# Patient Record
Sex: Female | Born: 1965 | Race: White | Hispanic: No | Smoking: Never smoker
Health system: Southern US, Community
[De-identification: ages and names within clinical notes are randomized; demographics above are authoritative.]

## PROBLEM LIST (undated history)

## (undated) HISTORY — PX: OTHER SURGICAL HISTORY: SHX169

---

## 1994-01-21 HISTORY — PX: OTHER SURGICAL HISTORY: SHX169

## 2015-04-18 DIAGNOSIS — M9901 Segmental and somatic dysfunction of cervical region: Secondary | ICD-10-CM | POA: Diagnosis not present

## 2015-04-18 DIAGNOSIS — M5137 Other intervertebral disc degeneration, lumbosacral region: Secondary | ICD-10-CM | POA: Diagnosis not present

## 2015-04-18 DIAGNOSIS — M4316 Spondylolisthesis, lumbar region: Secondary | ICD-10-CM | POA: Diagnosis not present

## 2015-04-18 DIAGNOSIS — M9903 Segmental and somatic dysfunction of lumbar region: Secondary | ICD-10-CM | POA: Diagnosis not present

## 2015-04-18 DIAGNOSIS — M9902 Segmental and somatic dysfunction of thoracic region: Secondary | ICD-10-CM | POA: Diagnosis not present

## 2015-04-19 DIAGNOSIS — M5137 Other intervertebral disc degeneration, lumbosacral region: Secondary | ICD-10-CM | POA: Diagnosis not present

## 2015-04-19 DIAGNOSIS — M9901 Segmental and somatic dysfunction of cervical region: Secondary | ICD-10-CM | POA: Diagnosis not present

## 2015-04-19 DIAGNOSIS — M9902 Segmental and somatic dysfunction of thoracic region: Secondary | ICD-10-CM | POA: Diagnosis not present

## 2015-04-19 DIAGNOSIS — M9903 Segmental and somatic dysfunction of lumbar region: Secondary | ICD-10-CM | POA: Diagnosis not present

## 2015-04-19 DIAGNOSIS — M4316 Spondylolisthesis, lumbar region: Secondary | ICD-10-CM | POA: Diagnosis not present

## 2015-04-25 DIAGNOSIS — M5137 Other intervertebral disc degeneration, lumbosacral region: Secondary | ICD-10-CM | POA: Diagnosis not present

## 2015-04-25 DIAGNOSIS — M9902 Segmental and somatic dysfunction of thoracic region: Secondary | ICD-10-CM | POA: Diagnosis not present

## 2015-04-25 DIAGNOSIS — M4316 Spondylolisthesis, lumbar region: Secondary | ICD-10-CM | POA: Diagnosis not present

## 2015-04-25 DIAGNOSIS — M9903 Segmental and somatic dysfunction of lumbar region: Secondary | ICD-10-CM | POA: Diagnosis not present

## 2015-04-25 DIAGNOSIS — M9901 Segmental and somatic dysfunction of cervical region: Secondary | ICD-10-CM | POA: Diagnosis not present

## 2015-04-26 DIAGNOSIS — M9902 Segmental and somatic dysfunction of thoracic region: Secondary | ICD-10-CM | POA: Diagnosis not present

## 2015-04-26 DIAGNOSIS — M5137 Other intervertebral disc degeneration, lumbosacral region: Secondary | ICD-10-CM | POA: Diagnosis not present

## 2015-04-26 DIAGNOSIS — M9903 Segmental and somatic dysfunction of lumbar region: Secondary | ICD-10-CM | POA: Diagnosis not present

## 2015-04-26 DIAGNOSIS — M4316 Spondylolisthesis, lumbar region: Secondary | ICD-10-CM | POA: Diagnosis not present

## 2015-04-26 DIAGNOSIS — M9901 Segmental and somatic dysfunction of cervical region: Secondary | ICD-10-CM | POA: Diagnosis not present

## 2015-04-28 DIAGNOSIS — M9902 Segmental and somatic dysfunction of thoracic region: Secondary | ICD-10-CM | POA: Diagnosis not present

## 2015-04-28 DIAGNOSIS — M4316 Spondylolisthesis, lumbar region: Secondary | ICD-10-CM | POA: Diagnosis not present

## 2015-04-28 DIAGNOSIS — M9901 Segmental and somatic dysfunction of cervical region: Secondary | ICD-10-CM | POA: Diagnosis not present

## 2015-04-28 DIAGNOSIS — M5137 Other intervertebral disc degeneration, lumbosacral region: Secondary | ICD-10-CM | POA: Diagnosis not present

## 2015-04-28 DIAGNOSIS — M9903 Segmental and somatic dysfunction of lumbar region: Secondary | ICD-10-CM | POA: Diagnosis not present

## 2015-05-02 DIAGNOSIS — M5137 Other intervertebral disc degeneration, lumbosacral region: Secondary | ICD-10-CM | POA: Diagnosis not present

## 2015-05-02 DIAGNOSIS — M9903 Segmental and somatic dysfunction of lumbar region: Secondary | ICD-10-CM | POA: Diagnosis not present

## 2015-05-02 DIAGNOSIS — M9901 Segmental and somatic dysfunction of cervical region: Secondary | ICD-10-CM | POA: Diagnosis not present

## 2015-05-02 DIAGNOSIS — M4316 Spondylolisthesis, lumbar region: Secondary | ICD-10-CM | POA: Diagnosis not present

## 2015-05-02 DIAGNOSIS — M9902 Segmental and somatic dysfunction of thoracic region: Secondary | ICD-10-CM | POA: Diagnosis not present

## 2015-05-03 DIAGNOSIS — M9901 Segmental and somatic dysfunction of cervical region: Secondary | ICD-10-CM | POA: Diagnosis not present

## 2015-05-03 DIAGNOSIS — M9903 Segmental and somatic dysfunction of lumbar region: Secondary | ICD-10-CM | POA: Diagnosis not present

## 2015-05-03 DIAGNOSIS — M9902 Segmental and somatic dysfunction of thoracic region: Secondary | ICD-10-CM | POA: Diagnosis not present

## 2015-05-03 DIAGNOSIS — M4316 Spondylolisthesis, lumbar region: Secondary | ICD-10-CM | POA: Diagnosis not present

## 2015-05-03 DIAGNOSIS — M5137 Other intervertebral disc degeneration, lumbosacral region: Secondary | ICD-10-CM | POA: Diagnosis not present

## 2015-05-05 DIAGNOSIS — M9902 Segmental and somatic dysfunction of thoracic region: Secondary | ICD-10-CM | POA: Diagnosis not present

## 2015-05-05 DIAGNOSIS — M9901 Segmental and somatic dysfunction of cervical region: Secondary | ICD-10-CM | POA: Diagnosis not present

## 2015-05-05 DIAGNOSIS — M5137 Other intervertebral disc degeneration, lumbosacral region: Secondary | ICD-10-CM | POA: Diagnosis not present

## 2015-05-05 DIAGNOSIS — M9903 Segmental and somatic dysfunction of lumbar region: Secondary | ICD-10-CM | POA: Diagnosis not present

## 2015-05-05 DIAGNOSIS — M4316 Spondylolisthesis, lumbar region: Secondary | ICD-10-CM | POA: Diagnosis not present

## 2015-05-16 DIAGNOSIS — M5137 Other intervertebral disc degeneration, lumbosacral region: Secondary | ICD-10-CM | POA: Diagnosis not present

## 2015-05-16 DIAGNOSIS — M4316 Spondylolisthesis, lumbar region: Secondary | ICD-10-CM | POA: Diagnosis not present

## 2015-05-16 DIAGNOSIS — M9901 Segmental and somatic dysfunction of cervical region: Secondary | ICD-10-CM | POA: Diagnosis not present

## 2015-05-16 DIAGNOSIS — M9903 Segmental and somatic dysfunction of lumbar region: Secondary | ICD-10-CM | POA: Diagnosis not present

## 2015-05-16 DIAGNOSIS — M9902 Segmental and somatic dysfunction of thoracic region: Secondary | ICD-10-CM | POA: Diagnosis not present

## 2015-05-19 DIAGNOSIS — M5137 Other intervertebral disc degeneration, lumbosacral region: Secondary | ICD-10-CM | POA: Diagnosis not present

## 2015-05-19 DIAGNOSIS — M9903 Segmental and somatic dysfunction of lumbar region: Secondary | ICD-10-CM | POA: Diagnosis not present

## 2015-05-19 DIAGNOSIS — M4316 Spondylolisthesis, lumbar region: Secondary | ICD-10-CM | POA: Diagnosis not present

## 2015-05-19 DIAGNOSIS — M9901 Segmental and somatic dysfunction of cervical region: Secondary | ICD-10-CM | POA: Diagnosis not present

## 2015-05-19 DIAGNOSIS — M9902 Segmental and somatic dysfunction of thoracic region: Secondary | ICD-10-CM | POA: Diagnosis not present

## 2015-05-26 DIAGNOSIS — M5137 Other intervertebral disc degeneration, lumbosacral region: Secondary | ICD-10-CM | POA: Diagnosis not present

## 2015-05-26 DIAGNOSIS — M9903 Segmental and somatic dysfunction of lumbar region: Secondary | ICD-10-CM | POA: Diagnosis not present

## 2015-05-26 DIAGNOSIS — M9902 Segmental and somatic dysfunction of thoracic region: Secondary | ICD-10-CM | POA: Diagnosis not present

## 2015-05-26 DIAGNOSIS — M4316 Spondylolisthesis, lumbar region: Secondary | ICD-10-CM | POA: Diagnosis not present

## 2015-05-26 DIAGNOSIS — M9901 Segmental and somatic dysfunction of cervical region: Secondary | ICD-10-CM | POA: Diagnosis not present

## 2015-06-01 DIAGNOSIS — M5137 Other intervertebral disc degeneration, lumbosacral region: Secondary | ICD-10-CM | POA: Diagnosis not present

## 2015-06-01 DIAGNOSIS — M9903 Segmental and somatic dysfunction of lumbar region: Secondary | ICD-10-CM | POA: Diagnosis not present

## 2015-06-01 DIAGNOSIS — M9902 Segmental and somatic dysfunction of thoracic region: Secondary | ICD-10-CM | POA: Diagnosis not present

## 2015-06-01 DIAGNOSIS — M4316 Spondylolisthesis, lumbar region: Secondary | ICD-10-CM | POA: Diagnosis not present

## 2015-06-01 DIAGNOSIS — M9901 Segmental and somatic dysfunction of cervical region: Secondary | ICD-10-CM | POA: Diagnosis not present

## 2018-01-23 DIAGNOSIS — D2262 Melanocytic nevi of left upper limb, including shoulder: Secondary | ICD-10-CM | POA: Diagnosis not present

## 2018-01-23 DIAGNOSIS — L570 Actinic keratosis: Secondary | ICD-10-CM | POA: Diagnosis not present

## 2018-01-23 DIAGNOSIS — Z85828 Personal history of other malignant neoplasm of skin: Secondary | ICD-10-CM | POA: Diagnosis not present

## 2018-01-23 DIAGNOSIS — Q825 Congenital non-neoplastic nevus: Secondary | ICD-10-CM | POA: Diagnosis not present

## 2018-03-18 ENCOUNTER — Other Ambulatory Visit: Payer: Self-pay | Admitting: Internal Medicine

## 2018-03-18 DIAGNOSIS — Z820 Family history of epilepsy and other diseases of the nervous system: Secondary | ICD-10-CM

## 2018-03-18 DIAGNOSIS — Z0000 Encounter for general adult medical examination without abnormal findings: Secondary | ICD-10-CM | POA: Diagnosis not present

## 2018-03-18 DIAGNOSIS — Z1331 Encounter for screening for depression: Secondary | ICD-10-CM | POA: Diagnosis not present

## 2018-03-18 DIAGNOSIS — Z823 Family history of stroke: Secondary | ICD-10-CM | POA: Diagnosis not present

## 2018-03-18 DIAGNOSIS — Z8249 Family history of ischemic heart disease and other diseases of the circulatory system: Secondary | ICD-10-CM

## 2018-03-31 ENCOUNTER — Ambulatory Visit
Admission: RE | Admit: 2018-03-31 | Discharge: 2018-03-31 | Disposition: A | Discharge status: Home / Self Care | Payer: BLUE CROSS/BLUE SHIELD | Source: Ambulatory Visit | Attending: Internal Medicine | Admitting: Internal Medicine

## 2018-03-31 DIAGNOSIS — Z8249 Family history of ischemic heart disease and other diseases of the circulatory system: Secondary | ICD-10-CM | POA: Diagnosis not present

## 2018-03-31 DIAGNOSIS — Z820 Family history of epilepsy and other diseases of the nervous system: Secondary | ICD-10-CM

## 2018-03-31 MED ORDER — IOPAMIDOL (ISOVUE-370) INJECTION 76%
75.0000 mL | Freq: Once | INTRAVENOUS | Status: AC | PRN
  Administered 2018-03-31: 75 mL via INTRAVENOUS

## 2018-07-27 DIAGNOSIS — Z20828 Contact with and (suspected) exposure to other viral communicable diseases: Secondary | ICD-10-CM | POA: Diagnosis not present

## 2018-08-07 ENCOUNTER — Encounter: Payer: Self-pay | Admitting: Gastroenterology

## 2018-08-25 ENCOUNTER — Ambulatory Visit (AMBULATORY_SURGERY_CENTER): Payer: Self-pay | Admitting: *Deleted

## 2018-08-25 ENCOUNTER — Other Ambulatory Visit: Payer: Self-pay

## 2018-08-25 VITALS — Temp 96.9°F | Ht 63.0 in | Wt 149.8 lb

## 2018-08-25 DIAGNOSIS — Z1211 Encounter for screening for malignant neoplasm of colon: Secondary | ICD-10-CM

## 2018-08-25 MED ORDER — NA SULFATE-K SULFATE-MG SULF 17.5-3.13-1.6 GM/177ML PO SOLN: 1.0000 | Freq: Once | ORAL | 0 refills | Status: AC

## 2018-08-25 NOTE — Progress Notes (Signed)
No egg or soy allergy known to patient  No issues with past sedation with any surgeries  or procedures, no intubation problems  No diet pills per patient No home 02 use per patient  No blood thinners per patient  Pt denies issues with constipation  No A fib or A flutter  EMMI video sent to pt's e mail  

## 2018-09-07 ENCOUNTER — Telehealth: Payer: Self-pay | Admitting: Gastroenterology

## 2018-09-07 NOTE — Telephone Encounter (Signed)

## 2018-09-08 ENCOUNTER — Other Ambulatory Visit: Payer: Self-pay

## 2018-09-08 ENCOUNTER — Ambulatory Visit (AMBULATORY_SURGERY_CENTER): Payer: BC Managed Care – PPO | Admitting: Gastroenterology

## 2018-09-08 ENCOUNTER — Encounter: Payer: Self-pay | Admitting: Gastroenterology

## 2018-09-08 VITALS — BP 101/59 | HR 56 | Temp 97.7°F | Resp 21 | Ht 63.0 in | Wt 149.0 lb

## 2018-09-08 DIAGNOSIS — Z1211 Encounter for screening for malignant neoplasm of colon: Secondary | ICD-10-CM | POA: Diagnosis not present

## 2018-09-08 DIAGNOSIS — K635 Polyp of colon: Secondary | ICD-10-CM

## 2018-09-08 DIAGNOSIS — D125 Benign neoplasm of sigmoid colon: Secondary | ICD-10-CM

## 2018-09-08 MED ORDER — SODIUM CHLORIDE 0.9 % IV SOLN: 500.0000 mL | INTRAVENOUS | Status: DC

## 2018-09-08 NOTE — Progress Notes (Signed)
Called to room to assist during endoscopic procedure.  Patient ID and intended procedure confirmed with present staff. Received instructions for my participation in the procedure from the performing physician.  

## 2018-09-08 NOTE — Op Note (Signed)
[facility] Patient Name: Amanda Kirby Procedure Date: 09/08/2018 9:46 AM MRN: [id number] Endoscopist: Ladene Artist , MD Age: 53 Referring MD:  Date of Birth: 04-03-1965 Gender: Female Account #: [id number] Procedure:                Colonoscopy Indications:              Screening for colorectal malignant neoplasm Medicines:                Monitored Anesthesia Care Procedure:                Pre-Anesthesia Assessment:                           - Prior to the procedure, a History and Physical                            was performed, and patient medications and                            allergies were reviewed. The patient's tolerance of                            previous anesthesia was also reviewed. The risks                            and benefits of the procedure and the sedation                            options and risks were discussed with the patient.                            All questions were answered, and informed consent                            was obtained. Prior Anticoagulants: The patient has                            taken no previous anticoagulant or antiplatelet                            agents. ASA Grade Assessment: II - A patient with                            mild systemic disease. After reviewing the risks                            and benefits, the patient was deemed in                            satisfactory condition to undergo the procedure.                           After obtaining informed consent, the colonoscope  was passed under direct vision. Throughout the                            procedure, the patient's blood pressure, pulse, and                            oxygen saturations were monitored continuously. The                            Colonoscope was introduced through the anus and                            advanced to the the cecum, identified by                            appendiceal orifice and  ileocecal valve. The                            ileocecal valve, appendiceal orifice, and rectum                            were photographed. The quality of the bowel                            preparation was excellent. The colonoscopy was                            performed without difficulty. The patient tolerated                            the procedure well. Scope In: 9:57:43 AM Scope Out: 10:15:17 AM Scope Withdrawal Time: 0 hours 10 minutes 42 seconds  Total Procedure Duration: 0 hours 17 minutes 34 seconds  Findings:                 The perianal and digital rectal examinations were                            normal.                           A 8 mm polyp was found in the sigmoid colon. The                            polyp was sessile. The polyp was removed with a                            cold snare. Resection and retrieval were complete.                           The exam was otherwise without abnormality on                            direct and retroflexion views. Complications:  No immediate complications. Estimated blood loss:                            None. Estimated Blood Loss:     Estimated blood loss: none. Impression:               - One 8 mm polyp in the sigmoid colon, removed with                            a cold snare. Resected and retrieved.                           - The examination was otherwise normal on direct                            and retroflexion views. Recommendation:           - Repeat colonoscopy timing based on pathology                            results.                           - Patient has a contact number available for                            emergencies. The signs and symptoms of potential                            delayed complications were discussed with the                            patient. Return to normal activities tomorrow.                            Written discharge instructions were provided to the                             patient.                           - Resume previous diet.                           - Continue present medications.                           - Await pathology results. Ladene Artist, MD 09/08/2018 10:20:44 AM This report has been signed electronically.

## 2018-09-08 NOTE — Progress Notes (Signed)
To PACU, VSS. Report to [email or web address] 

## 2018-09-08 NOTE — Progress Notes (Signed)
Nancy vital signs, Step. Temp. Celia IV, Coretta Leisey Checked in. Pt's states no medical or surgical changes since previsit .

## 2018-09-08 NOTE — Patient Instructions (Signed)
Information on polyps given to you today.  Await pathology results.   YOU HAD AN ENDOSCOPIC PROCEDURE TODAY AT THE [facility]:   Refer to the procedure report that was given to you for any specific questions about what was found during the examination.  If the procedure report does not answer your questions, please call your gastroenterologist to clarify.  If you requested that your care partner not be given the details of your procedure findings, then the procedure report has been included in a sealed envelope for you to review at your convenience later.  YOU SHOULD EXPECT: Some feelings of bloating in the abdomen. Passage of more gas than usual.  Walking can help get rid of the air that was put into your GI tract during the procedure and reduce the bloating. If you had a lower endoscopy (such as a colonoscopy or flexible sigmoidoscopy) you may notice spotting of blood in your stool or on the toilet paper. If you underwent a bowel prep for your procedure, you may not have a normal bowel movement for a few days.  Please Note:  You might notice some irritation and congestion in your nose or some drainage.  This is from the oxygen used during your procedure.  There is no need for concern and it should clear up in a day or so.  SYMPTOMS TO REPORT IMMEDIATELY:   Following lower endoscopy (colonoscopy or flexible sigmoidoscopy):  Excessive amounts of blood in the stool  Significant tenderness or worsening of abdominal pains  Swelling of the abdomen that is new, acute  Fever of 100F or higher   For urgent or emergent issues, a gastroenterologist can be reached at any hour by calling [phone].   DIET:  We do recommend a small meal at first, but then you may proceed to your regular diet.  Drink plenty of fluids but you should avoid alcoholic beverages for 24 hours.  ACTIVITY:  You should plan to take it easy for the rest of today and you should NOT DRIVE or use heavy machinery  until tomorrow (because of the sedation medicines used during the test).    FOLLOW UP: Our staff will call the number listed on your records 48-72 hours following your procedure to check on you and address any questions or concerns that you may have regarding the information given to you following your procedure. If we do not reach you, we will leave a message.  We will attempt to reach you two times.  During this call, we will ask if you have developed any symptoms of COVID 19. If you develop any symptoms (ie: fever, flu-like symptoms, shortness of breath, cough etc.) before then, please call [phone].  If you test positive for Covid 19 in the 2 weeks post procedure, please call and report this information to us.    If any biopsies were taken you will be contacted by phone or by letter within the next 1-3 weeks.  Please call us at [phone] if you have not heard about the biopsies in 3 weeks.    SIGNATURES/CONFIDENTIALITY: You and/or your care partner have signed paperwork which will be entered into your electronic medical record.  These signatures attest to the fact that that the information above on your After Visit Summary has been reviewed and is understood.  Full responsibility of the confidentiality of this discharge information lies with you and/or your care-partner. 

## 2018-09-10 ENCOUNTER — Telehealth: Payer: Self-pay

## 2018-09-10 NOTE — Telephone Encounter (Signed)
  Follow up Call-  Call back number 09/08/2018  Post procedure Call Back phone  # [phone]  Permission to leave phone message Yes  Some recent data might be hidden     Patient questions:  Do you have a fever, pain , or abdominal swelling? No. Pain Score  0 *  Have you tolerated food without any problems? Yes.    Have you been able to return to your normal activities? Yes.    Do you have any questions about your discharge instructions: Diet   No. Medications  No. Follow up visit  No.  Do you have questions or concerns about your Care? No.  Actions: * If pain score is 4 or above: 1. No action needed, pain <4.Have you developed a fever since your procedure? no  2.   Have you had an respiratory symptoms (SOB or cough) since your procedure? no  3.   Have you tested positive for COVID 19 since your procedure no  4.   Have you had any family members/close contacts diagnosed with the COVID 19 since your procedure?  no   If yes to any of these questions please route to Joylene John, RN and Alphonsa Gin, Therapist, sports.

## 2018-09-28 ENCOUNTER — Encounter: Payer: Self-pay | Admitting: Gastroenterology

## 2019-03-20 ENCOUNTER — Ambulatory Visit: Payer: Self-pay | Attending: Internal Medicine

## 2019-03-20 DIAGNOSIS — Z23 Encounter for immunization: Secondary | ICD-10-CM | POA: Insufficient documentation

## 2019-03-20 NOTE — Progress Notes (Signed)
   Covid-19 Vaccination Clinic  Name:  Amanda Kirby    MRN: [id number] DOB: March 01, 1965  03/20/2019  Amanda Kirby was observed post Covid-19 immunization for 15 minutes without incidence. She was provided with Vaccine Information Sheet and instruction to access the V-Safe system.   Amanda Kirby was instructed to call 911 with any severe reactions post vaccine: Marland Kitchen Difficulty breathing  . Swelling of your face and throat  . A fast heartbeat  . A bad rash all over your body  . Dizziness and weakness    Immunizations Administered    Name Date Dose VIS Date Route   Pfizer COVID-19 Vaccine 03/20/2019  9:00 AM 0.3 mL 01/01/2019 Intramuscular   Manufacturer: [place]   Lot: [id number]   [place]: [id number]

## 2019-04-14 ENCOUNTER — Ambulatory Visit: Payer: Self-pay

## 2019-04-26 ENCOUNTER — Ambulatory Visit: Payer: Self-pay | Attending: Internal Medicine

## 2019-04-26 DIAGNOSIS — Z23 Encounter for immunization: Secondary | ICD-10-CM

## 2019-04-26 NOTE — Progress Notes (Signed)
   Covid-19 Vaccination Clinic  Name:  Amanda Kirby    MRN: [id number] DOB: 03/11/1965  04/26/2019  [name] was observed post Covid-19 immunization for 15 minutes without incident. She was provided with Vaccine Information Sheet and instruction to access the V-Safe system.   [name] was instructed to call 911 with any severe reactions post vaccine: Marland Kitchen Difficulty breathing  . Swelling of face and throat  . A fast heartbeat  . A bad rash all over body  . Dizziness and weakness   Immunizations Administered    Name Date Dose VIS Date Route   Pfizer COVID-19 Vaccine 04/26/2019  9:49 AM 0.3 mL 01/01/2019 Intramuscular   Manufacturer: [place]   Lot: [id number]   [place]: [id number]

## 2019-07-10 IMAGING — CT CT ANGIOGRAPHY HEAD
1 of 5 series · 4 of 32 positions shown, 9 images · IV contrast (APPLIED)
Comparison: None.

CLINICAL DATA: Family history of cerebral aneurysm and hemorrhage

EXAM:
CT ANGIOGRAPHY HEAD
TECHNIQUE: Multidetector CT imaging of the head was performed using the
standard protocol during bolus administration of intravenous
contrast. Multiplanar CT image reconstructions and MIPs were
obtained to evaluate the vascular anatomy.
CONTRAST:  75mL BWIEQN-H79 IOPAMIDOL (BWIEQN-H79) INJECTION 76%

[Series 6: head angio · axial · 0.42mm/px · z∈[+27,+132]mm · 4 of 59 slices shown, 9 images]
[im 12/59  soft-tissue]
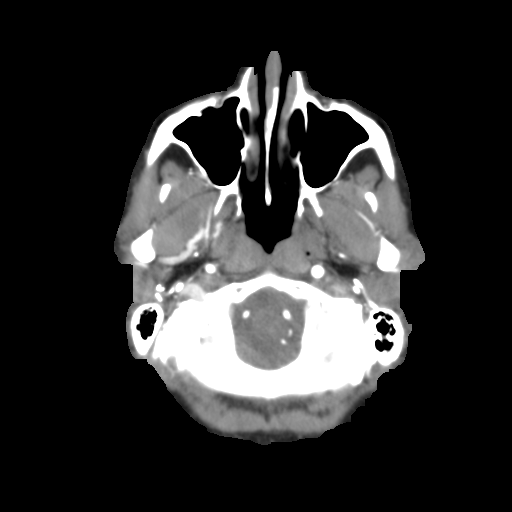
[im 12/59  lung]
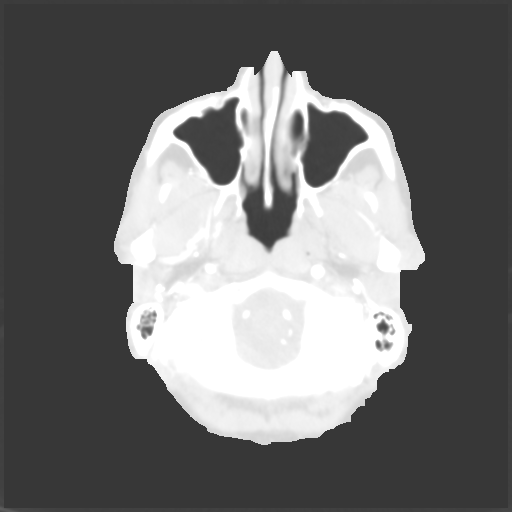
[im 12/59  bone]
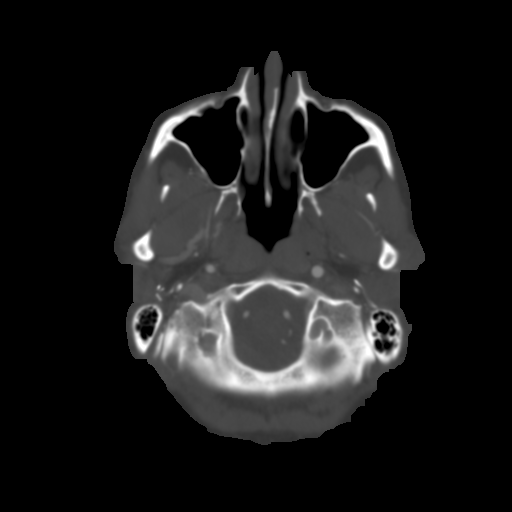
[im 24/59  soft-tissue]
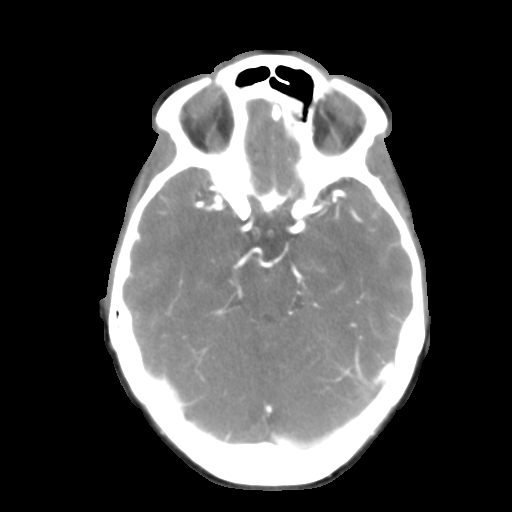
[im 24/59  lung]
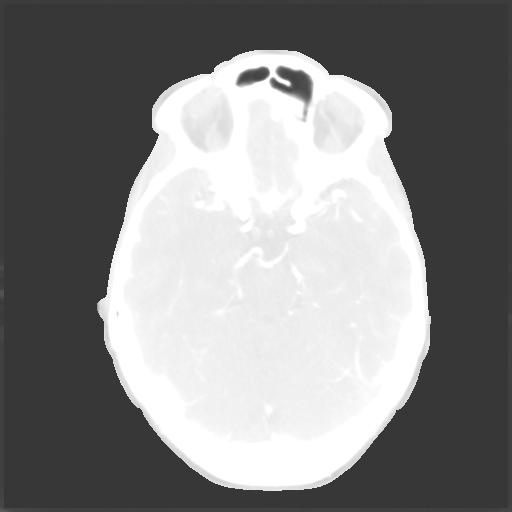
[im 35/59  soft-tissue]
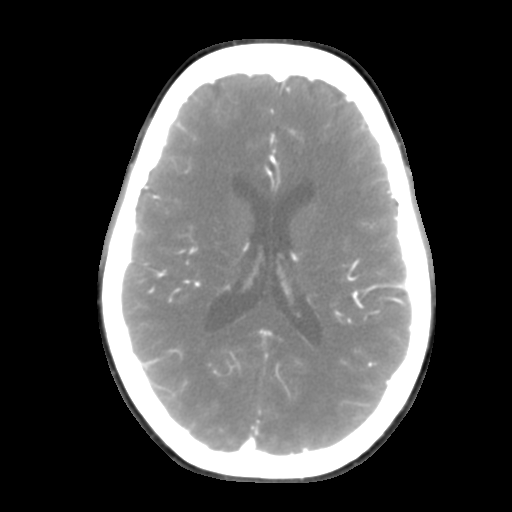
[im 35/59  lung]
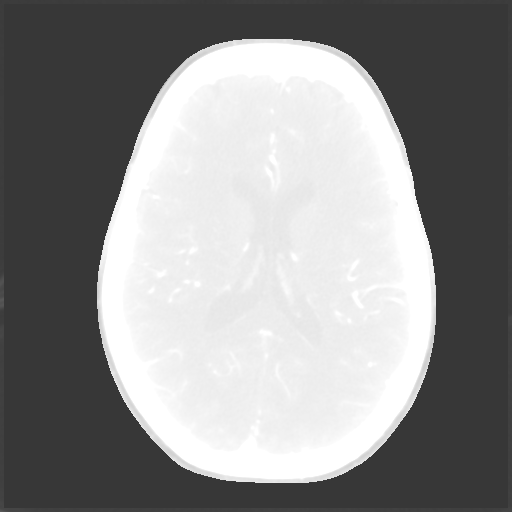
[im 47/59  soft-tissue]
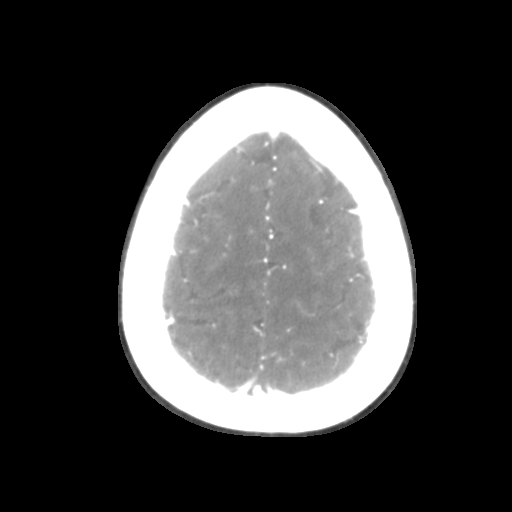
[im 47/59  lung]
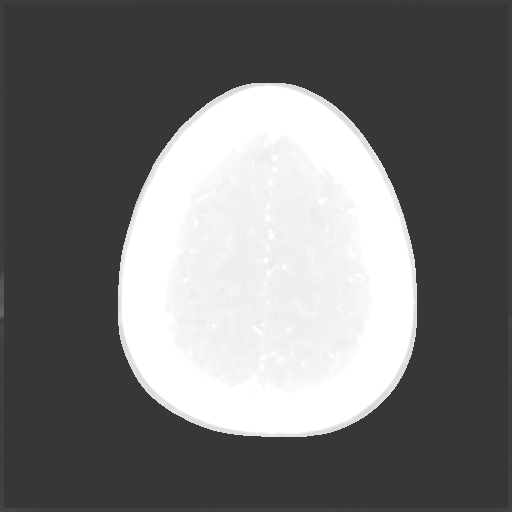

[4 of 32 positions shown; findings below may reference images not displayed]

FINDINGS: CT HEAD

Brain: No evidence of acute infarction, hemorrhage, hydrocephalus,
extra-axial collection or mass lesion/mass effect.

Vascular: Negative for hyperdense vessel

Skull: Negative

Sinuses: Negative

Orbits: Negative

CTA HEAD

Anterior circulation: No significant stenosis, proximal occlusion,
aneurysm, or vascular malformation.

Posterior circulation: No significant stenosis, proximal occlusion,
aneurysm, or vascular malformation. Fetal origin left posterior
cerebral artery.

Venous sinuses: Normal venous enhancement.

Anatomic variants: None

Delayed phase: Normal enhancement on delayed imaging.
IMPRESSION: Negative CTA head.  Negative for aneurysm.

## 2023-10-15 NOTE — Progress Notes (Unsigned)
    Amanda Kirby [facility] [address] [place] [address] Phone: [phone]   Assessment and Plan:     1. Right leg pain (Primary) 2. Chronic bilateral low back pain with right-sided sciatica 3. 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  Patient has no significant lower back pain, however right sided leg symptoms are consistent with lumbar radiculopathy - Patient has trialed conservative therapy including home exercises, physical therapy, massage, chiropractic care, NSAIDs, Tylenol with no significant relief - Recommend epidural CSI to right sided L5-S1.  Order placed today - Start HEP for low back, piriformis  15 additional minutes spent for educating Therapeutic Home Exercise Program.  This included exercises focusing on stretching, strengthening, with focus on eccentric aspects.   Long term goals include an improvement in range of motion, strength, endurance as well as avoiding reinjury. Patient's frequency would include in 1-2 times a day, 3-5 times a week for a duration of 6-12 weeks. Proper technique shown and discussed handout in great detail with ATC.  All questions were discussed and answered.    Pertinent previous records reviewed include MRI report 08/23/2023   Follow Up: 2 weeks after epidural to review benefit.  Could consider repeat epidural if needed   Subjective:   I, Amanda Kirby, am serving as a Neurosurgeon for Doctor Morene Mace  Chief Complaint: right leg pain   HPI:   10/16/2023 Patient is a 58 year old female with right leg pain. Patient states pain for about 3 years. Has tried a lot of things. Massage,chiro, yoga, KT tape, mobility exercises,biofreeze, ibu and those haven't  helped. Right glute pain that radiates down the leg to the calf and ankle.pain flares at night early am and when she walks for distance. No numbness or tingling. Side lying on either side pain is increased. The motion of walking causes pain. Seems that when she hits about the 1 mile 1/2 mile range is when pain really flares    Relevant Historical Information: None pertinent  Additional pertinent review of systems negative.   Current Outpatient Medications:    Calcium-Vitamins C & D (CALCIUM/C/D PO), Take by mouth daily., Disp: , Rfl:    Objective:     Vitals:   10/16/23 1523  BP: 120/84  Pulse: 78  SpO2: 98%  Height: 5' 3 (1.6 m)      Body mass index is 26.39 kg/m.    Physical Exam:    Gen: Appears well, nad, nontoxic and pleasant Psych: Alert and oriented, appropriate mood and affect Neuro: sensation intact, strength is 5/5 in upper and lower extremities, muscle tone wnl Skin: no susupicious lesions or rashes  Back - Normal skin, Spine with normal alignment and no deformity.   No tenderness to vertebral process palpation.   Paraspinous muscles are not tender and without spasm NTTP gluteal musculature Straight leg raise negative Trendelenberg positive right, negative left Piriformis Test positive right, negative left Gait normal    Electronically signed by:  Odis Mace [facility] 3:47 PM 10/16/23

## 2023-10-16 ENCOUNTER — Ambulatory Visit (INDEPENDENT_AMBULATORY_CARE_PROVIDER_SITE_OTHER): Payer: Self-pay | Admitting: Sports Medicine

## 2023-10-16 VITALS — BP 120/84 | HR 78 | Ht 63.0 in

## 2023-10-16 DIAGNOSIS — M51362 Other intervertebral disc degeneration, lumbar region with discogenic back pain and lower extremity pain: Secondary | ICD-10-CM

## 2023-10-16 DIAGNOSIS — M79604 Pain in right leg: Secondary | ICD-10-CM

## 2023-10-16 DIAGNOSIS — G8929 Other chronic pain: Secondary | ICD-10-CM | POA: Diagnosis not present

## 2023-10-16 NOTE — Patient Instructions (Signed)
 Epidural right L5-S1  Low back HEP   Follow up 2 weeks after injection to discuss results

## 2023-10-30 ENCOUNTER — Encounter: Payer: Self-pay | Admitting: Sports Medicine

## 2023-11-03 NOTE — Discharge Instructions (Signed)

## 2023-11-04 ENCOUNTER — Ambulatory Visit
Admission: RE | Admit: 2023-11-04 | Discharge: 2023-11-04 | Disposition: A | Discharge status: Home / Self Care | Source: Ambulatory Visit | Attending: Sports Medicine | Admitting: Sports Medicine

## 2023-11-04 DIAGNOSIS — G8929 Other chronic pain: Secondary | ICD-10-CM

## 2023-11-04 DIAGNOSIS — M51362 Other intervertebral disc degeneration, lumbar region with discogenic back pain and lower extremity pain: Secondary | ICD-10-CM

## 2023-11-04 DIAGNOSIS — M79604 Pain in right leg: Secondary | ICD-10-CM

## 2023-11-04 MED ORDER — IOPAMIDOL (ISOVUE-M 200) INJECTION 41%
1.0000 mL | Freq: Once | INTRAMUSCULAR | Status: AC
  Administered 2023-11-04: 1 mL via INTRATHECAL

## 2023-11-06 ENCOUNTER — Other Ambulatory Visit: Payer: Self-pay | Admitting: Sports Medicine

## 2023-11-06 DIAGNOSIS — M51362 Other intervertebral disc degeneration, lumbar region with discogenic back pain and lower extremity pain: Secondary | ICD-10-CM

## 2023-11-06 DIAGNOSIS — M5441 Lumbago with sciatica, right side: Secondary | ICD-10-CM

## 2023-11-06 DIAGNOSIS — M545 Low back pain, unspecified: Secondary | ICD-10-CM

## 2023-11-06 DIAGNOSIS — M79604 Pain in right leg: Secondary | ICD-10-CM

## 2023-11-12 NOTE — Discharge Instructions (Signed)

## 2023-11-13 ENCOUNTER — Ambulatory Visit
Admission: RE | Admit: 2023-11-13 | Discharge: 2023-11-13 | Disposition: A | Discharge status: Home / Self Care | Source: Ambulatory Visit | Attending: Sports Medicine | Admitting: Sports Medicine

## 2023-11-13 DIAGNOSIS — M79604 Pain in right leg: Secondary | ICD-10-CM

## 2023-11-13 DIAGNOSIS — M5441 Lumbago with sciatica, right side: Secondary | ICD-10-CM

## 2023-11-13 DIAGNOSIS — M51362 Other intervertebral disc degeneration, lumbar region with discogenic back pain and lower extremity pain: Secondary | ICD-10-CM

## 2023-11-13 DIAGNOSIS — M545 Low back pain, unspecified: Secondary | ICD-10-CM

## 2023-11-13 MED ORDER — METHYLPREDNISOLONE ACETATE 40 MG/ML INJ SUSP (RADIOLOG
80.0000 mg | Freq: Once | INTRAMUSCULAR | Status: AC
  Administered 2023-11-13: 80 mg via EPIDURAL

## 2023-11-13 MED ORDER — IOPAMIDOL (ISOVUE-M 200) INJECTION 41%
1.0000 mL | Freq: Once | INTRAMUSCULAR | Status: AC
  Administered 2023-11-13: 1 mL via EPIDURAL

## 2023-11-14 ENCOUNTER — Ambulatory Visit: Admitting: Sports Medicine

## 2023-12-02 NOTE — Progress Notes (Unsigned)
    Amanda Kirby [facility] [address] [place] [address] Phone: [phone]   Assessment and Plan:     1. Right leg pain (Primary) 2. Chronic bilateral low back pain with right-sided sciatica 3. Degeneration of intervertebral disc of lumbar region with discogenic back pain and lower extremity pain -Chronic with exacerbation, subsequent visit - Overall significant, 70%, improvement after epidural CSI to right sided L5-S1 on 11/13/2023 with decreased need for pain medication and improved function after injection.  Still most consistent with improving symptoms from bilateral foraminal stenosis at L5-S1 with anterolisthesis grade 1 of L5 on S1 as seen on lumbar MRI - Recommend second epidural injection to allow for further improvement with patient still experiencing morning pain and pain with prolonged physical activity - Patient has had no significant relief with conservative therapy including home exercises, physical therapy, massage therapy, chiropractic care, NSAIDs, Tylenol - Use Tylenol 500 to 1000 mg tablets 2-3 times a day for day-to-day pain relief - Continue HEP    Pertinent previous records reviewed include lumbar MRI 08/23/2023, epidural procedure note 11/04/2023, epidural procedure note 11/13/2023  Lumbar MRI IMPRESSION: 1. Moderate bilateral neural foraminal stenosis at L5-S1. 2. 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 3. No substantial lumbosacral spinal canal stenosis.    Follow Up: 2 weeks after epidural CSI to review benefit.  Could consider additional epidural if necessary   Subjective:   I, Francoise Chojnowski, am serving as a neurosurgeon for Doctor Morene Mace   Chief Complaint: right leg pain    HPI:    10/16/2023 Patient is a 58 year old female with right leg pain. Patient states pain for about 3 years. Has tried a lot of things. Massage,chiro, yoga, KT tape, mobility  exercises,biofreeze, ibu and those haven't helped. Right glute pain that radiates down the leg to the calf and ankle.pain flares at night early am and when she walks for distance. No numbness or tingling. Side lying on either side pain is increased. The motion of walking causes pain. Seems that when she hits about the 1 mile 1/2 mile range is when pain really flares    12/03/2023 Patient states that she is better. Past two weeks she has woken up to 1/10 pain.    Relevant Historical Information: None pertinent  Additional pertinent review of systems negative.   Current Outpatient Medications:    Calcium-Vitamins C & D (CALCIUM/C/D PO), Take by mouth daily., Disp: , Rfl:    Objective:     Vitals:   12/03/23 0752  BP: 122/80  Pulse: 82  SpO2: 99%  Weight: 148 lb (67.1 kg)  Height: 5' 3 (1.6 m)      Body mass index is 26.22 kg/m.    Physical Exam:    Gen: Appears well, nad, nontoxic and pleasant Psych: Alert and oriented, appropriate mood and affect Neuro: sensation intact, strength is 5/5 in upper and lower extremities, muscle tone wnl Skin: no susupicious lesions or rashes   Back - Normal skin, Spine with normal alignment and no deformity.   No tenderness to vertebral process palpation.   Paraspinous muscles are not tender and without spasm NTTP gluteal musculature Straight leg raise negative Trendelenberg positive right, negative left Piriformis Test positive right, negative left Gait normal   Electronically signed by:  Odis Mace Kirby [facility] 8:04 AM 12/03/23

## 2023-12-03 ENCOUNTER — Ambulatory Visit (INDEPENDENT_AMBULATORY_CARE_PROVIDER_SITE_OTHER): Admitting: Sports Medicine

## 2023-12-03 VITALS — BP 122/80 | HR 82 | Ht 63.0 in | Wt 148.0 lb

## 2023-12-03 DIAGNOSIS — G8929 Other chronic pain: Secondary | ICD-10-CM | POA: Diagnosis not present

## 2023-12-03 DIAGNOSIS — M79604 Pain in right leg: Secondary | ICD-10-CM

## 2023-12-03 DIAGNOSIS — M51362 Other intervertebral disc degeneration, lumbar region with discogenic back pain and lower extremity pain: Secondary | ICD-10-CM | POA: Diagnosis not present

## 2023-12-03 NOTE — Patient Instructions (Signed)
Epidural right L5-S1 Follow up 2 weeks after to discuss results

## 2023-12-17 ENCOUNTER — Encounter: Payer: Self-pay | Admitting: Sports Medicine

## 2023-12-26 NOTE — Discharge Instructions (Signed)

## 2023-12-29 ENCOUNTER — Inpatient Hospital Stay
Admission: RE | Admit: 2023-12-29 | Discharge: 2023-12-29 | Disposition: A | Source: Ambulatory Visit | Attending: Sports Medicine

## 2023-12-29 DIAGNOSIS — M51362 Other intervertebral disc degeneration, lumbar region with discogenic back pain and lower extremity pain: Secondary | ICD-10-CM

## 2023-12-29 DIAGNOSIS — M79604 Pain in right leg: Secondary | ICD-10-CM

## 2023-12-29 DIAGNOSIS — G8929 Other chronic pain: Secondary | ICD-10-CM

## 2023-12-29 MED ORDER — METHYLPREDNISOLONE ACETATE 40 MG/ML INJ SUSP (RADIOLOG
80.0000 mg | Freq: Once | INTRAMUSCULAR | Status: AC
  Administered 2023-12-29: 80 mg via EPIDURAL

## 2023-12-29 MED ORDER — IOPAMIDOL (ISOVUE-M 200) INJECTION 41%
1.0000 mL | Freq: Once | INTRAMUSCULAR | Status: AC
  Administered 2023-12-29: 1 mL via EPIDURAL

## 2024-01-08 NOTE — Progress Notes (Unsigned)
° °   Amanda Kirby [facility] [address] [place] [address] Phone: [phone]   Assessment and Plan:     ***    Pertinent previous records reviewed include ***   Follow Up: ***     Subjective:   I, Amanda Kirby, am serving as a neurosurgeon for Doctor Morene Mace   Chief Complaint: right leg pain    HPI:    10/16/2023 Patient is a 58 year old female with right leg pain. Patient states pain for about 3 years. Has tried a lot of things. Massage,chiro, yoga, KT tape, mobility exercises,biofreeze, ibu and those haven't helped. Right glute pain that radiates down the leg to the calf and ankle.pain flares at night early am and when she walks for distance. No numbness or tingling. Side lying on either side pain is increased. The motion of walking causes pain. Seems that when she hits about the 1 mile 1/2 mile range is when pain really flares    12/03/2023 Patient states that she is better. Past two weeks she has woken up to 1/10 pain.   01/09/2024 Patient states   Relevant Historical Information: None pertinent  Additional pertinent review of systems negative.  Current Medications[1]   Objective:     There were no vitals filed for this visit.    There is no height or weight on file to calculate BMI.    Physical Exam:    ***   Electronically signed by:  Odis Mace [facility] 7:34 AM 01/08/2024    [1]  Current Outpatient Medications:    Calcium-Vitamins C & D (CALCIUM/C/D PO), Take by mouth daily., Disp: , Rfl:

## 2024-01-09 ENCOUNTER — Ambulatory Visit: Admitting: Sports Medicine

## 2024-01-09 VITALS — BP 124/68 | HR 103 | Ht 63.0 in | Wt 147.0 lb

## 2024-01-09 DIAGNOSIS — M5441 Lumbago with sciatica, right side: Secondary | ICD-10-CM

## 2024-01-09 DIAGNOSIS — M79604 Pain in right leg: Secondary | ICD-10-CM

## 2024-01-09 DIAGNOSIS — G8929 Other chronic pain: Secondary | ICD-10-CM

## 2024-01-09 NOTE — Patient Instructions (Addendum)
 If pain returns identically, call and ask for a repeat epidural. If pain is different call and ask for a follow up   As needed follow up
